# Patient Record
Sex: Male | Born: 1940 | Race: White
Health system: Northeastern US, Academic
[De-identification: ages and names within clinical notes are randomized; demographics above are authoritative.]

## PROBLEM LIST (undated history)

## (undated) DIAGNOSIS — Z1501 Genetic susceptibility to malignant neoplasm of breast: Secondary | ICD-10-CM

## (undated) DIAGNOSIS — R011 Cardiac murmur, unspecified: Secondary | ICD-10-CM

## (undated) DIAGNOSIS — R7302 Impaired glucose tolerance (oral): Secondary | ICD-10-CM

## (undated) DIAGNOSIS — N400 Enlarged prostate without lower urinary tract symptoms: Secondary | ICD-10-CM

## (undated) DIAGNOSIS — E785 Hyperlipidemia, unspecified: Secondary | ICD-10-CM

## (undated) DIAGNOSIS — Z1509 Genetic susceptibility to other malignant neoplasm: Secondary | ICD-10-CM

## (undated) HISTORY — DX: Benign prostatic hyperplasia without lower urinary tract symptoms: N40.0

## (undated) HISTORY — DX: Genetic susceptibility to malignant neoplasm of breast: Z15.01

## (undated) HISTORY — PX: CHOLECYSTECTOMY: SHX55

## (undated) HISTORY — DX: Genetic susceptibility to other malignant neoplasm: Z15.09

## (undated) HISTORY — DX: Cardiac murmur, unspecified: R01.1

## (undated) HISTORY — DX: Impaired glucose tolerance (oral): R73.02

## (undated) HISTORY — PX: TONSILLECTOMY: SUR1361

## (undated) HISTORY — PX: CIRCUMCISION: SUR203

## (undated) HISTORY — DX: Hyperlipidemia, unspecified: E78.5

---

## 2006-10-19 ENCOUNTER — Emergency Department: Payer: Self-pay | Admitting: Internal Medicine

## 2007-09-18 ENCOUNTER — Ambulatory Visit: Payer: Self-pay | Admitting: Gastroenterology

## 2007-10-14 ENCOUNTER — Ambulatory Visit: Payer: Self-pay | Admitting: Oncology

## 2007-10-29 ENCOUNTER — Ambulatory Visit: Payer: Self-pay | Admitting: Oncology

## 2007-11-11 ENCOUNTER — Ambulatory Visit: Payer: Self-pay | Admitting: Oncology

## 2011-06-13 ENCOUNTER — Ambulatory Visit: Payer: Self-pay | Admitting: Family Medicine

## 2011-12-15 ENCOUNTER — Ambulatory Visit: Payer: Self-pay | Admitting: Family Medicine

## 2012-07-16 DIAGNOSIS — R339 Retention of urine, unspecified: Secondary | ICD-10-CM | POA: Insufficient documentation

## 2012-07-16 DIAGNOSIS — N529 Male erectile dysfunction, unspecified: Secondary | ICD-10-CM | POA: Insufficient documentation

## 2012-09-24 ENCOUNTER — Ambulatory Visit: Payer: Self-pay | Admitting: Family Medicine

## 2012-12-31 ENCOUNTER — Ambulatory Visit: Payer: Self-pay

## 2013-07-11 ENCOUNTER — Ambulatory Visit: Payer: Self-pay | Admitting: Family Medicine

## 2013-12-18 ENCOUNTER — Ambulatory Visit: Payer: Self-pay | Admitting: Family Medicine

## 2014-01-13 ENCOUNTER — Ambulatory Visit (INDEPENDENT_AMBULATORY_CARE_PROVIDER_SITE_OTHER): Payer: Commercial Managed Care - HMO | Admitting: Family Medicine

## 2014-01-13 ENCOUNTER — Encounter: Payer: Self-pay | Admitting: Family Medicine

## 2014-01-13 VITALS — BP 136/60 | HR 59 | Temp 97.9°F | Resp 16 | Ht 67.5 in | Wt 179.2 lb

## 2014-01-13 DIAGNOSIS — E7800 Pure hypercholesterolemia, unspecified: Secondary | ICD-10-CM

## 2014-01-13 DIAGNOSIS — R1013 Epigastric pain: Secondary | ICD-10-CM

## 2014-01-13 LAB — COMPREHENSIVE METABOLIC PANEL
ALT: 14 U/L (ref 0–53)
AST: 16 U/L (ref 0–37)
Albumin: 4.5 g/dL (ref 3.5–5.2)
Alkaline Phosphatase: 50 U/L (ref 39–117)
BUN: 15 mg/dL (ref 6–23)
CALCIUM: 9.1 mg/dL (ref 8.4–10.5)
CO2: 27 meq/L (ref 19–32)
Chloride: 103 mEq/L (ref 96–112)
Creat: 0.88 mg/dL (ref 0.50–1.35)
GLUCOSE: 101 mg/dL — AB (ref 70–99)
Potassium: 5 mEq/L (ref 3.5–5.3)
SODIUM: 138 meq/L (ref 135–145)
TOTAL PROTEIN: 7.1 g/dL (ref 6.0–8.3)
Total Bilirubin: 0.7 mg/dL (ref 0.2–1.2)

## 2014-01-13 LAB — LIPID PANEL
CHOLESTEROL: 167 mg/dL (ref 0–200)
HDL: 66 mg/dL (ref >39)
LDL Cholesterol: 87 mg/dL (ref 0–99)
Total CHOL/HDL Ratio: 2.5 Ratio
Triglycerides: 71 mg/dL (ref <150)
VLDL: 14 mg/dL (ref 0–40)

## 2014-01-13 LAB — CBC WITH DIFFERENTIAL/PLATELET
Basophils Absolute: 0 10*3/uL (ref 0.0–0.1)
Basophils Relative: 0 % (ref 0–1)
Eosinophils Absolute: 0.1 10*3/uL (ref 0.0–0.7)
Eosinophils Relative: 1 % (ref 0–5)
HCT: 40.1 % (ref 39.0–52.0)
HEMOGLOBIN: 14 g/dL (ref 13.0–17.0)
LYMPHS ABS: 1.6 10*3/uL (ref 0.7–4.0)
Lymphocytes Relative: 19 % (ref 12–46)
MCH: 32.6 pg (ref 26.0–34.0)
MCHC: 34.9 g/dL (ref 30.0–36.0)
MCV: 93.3 fL (ref 78.0–100.0)
MONOS PCT: 8 % (ref 3–12)
Monocytes Absolute: 0.7 10*3/uL (ref 0.1–1.0)
NEUTROS ABS: 6.2 10*3/uL (ref 1.7–7.7)
Neutrophils Relative %: 72 % (ref 43–77)
Platelets: 300 10*3/uL (ref 150–400)
RBC: 4.3 MIL/uL (ref 4.22–5.81)
RDW: 12.7 % (ref 11.5–15.5)
WBC: 8.6 10*3/uL (ref 4.0–10.5)

## 2014-01-13 LAB — POCT URINALYSIS DIPSTICK
BILIRUBIN UA: NEGATIVE
Glucose, UA: NEGATIVE
KETONES UA: NEGATIVE
Leukocytes, UA: NEGATIVE
Nitrite, UA: NEGATIVE
Protein, UA: NEGATIVE
RBC UA: NEGATIVE
Spec Grav, UA: 1.025
Urobilinogen, UA: 0.2
pH, UA: 5.5

## 2014-01-13 MED ORDER — RANITIDINE HCL 150 MG PO CAPS: 150.0000 mg | ORAL_CAPSULE | Freq: Every day | ORAL | Status: DC

## 2014-01-13 MED ORDER — OMEPRAZOLE 20 MG PO CPDR: 20.0000 mg | DELAYED_RELEASE_CAPSULE | Freq: Every day | ORAL | Status: DC

## 2014-01-13 NOTE — Progress Notes (Signed)
Subjective:   This chart was scribed for Wardell Honour, MD by Forrestine Him, [facility] Scribe. This patient was seen in room 21 and the patient's care was started 10:23 AM.    Patient ID: Frederick Reeves, male    DOB: [birth year], 73 y.o.   MRN: [id number]  01/13/2014  stoamch pain x 6 months   HPI  HPI Comments: Frederick Reeves is a 73 y.o. male who presents to [facility] complaining of intermittent, moderate abdominal pain x 6 months that is unchanged. Describes this pain as "pressure" and "achy". Pt typically notes the pain at night time in the middle of the night; sometimes intense enough to wake him from sleep. States his appetite is not effected by this pain. He consumes a consistent small meal at noon daily consisting of lettuce, apples, onions, and tomatoes. Denies experiencing this pain after eating these meals. He denies any associated nausea, vomiting, diarrhea, belching, constipation, SOB, hematuria, dysuria, urinary frequency, bloody stool, or weight change with the abdominal pain. Pt was initially seen by Dr. Allen Norris of GI and was told his abdominal pain was related to air in his GI tract. Pt states he was then seen by Dr. Caryn Section for same and was treated with prescribed Hyoscyamine. Pt states this medication has not been effective for him so he stopped taking it after a few doses/two days. He has also tried OTC Ibuprofen with no improvement. States the discomfort is sometimes alleviating by consuming chocolate and passing gas. Unaware of any known foods that worsen his pain. Pt states he is taking Naproxen currently for knee pain. He has no other pertinent past medical history. No other concerns this visit.  Strong family history of cancer.  He carries the BRCA gene; both daughters have had breast cancer.    States he is working out about 3-4 times a week. No chest pain with exercise.  Brother recently died at the age of 8 of prostate and liver  cancer. Son in law also recently passed of prostate cancer.  Also presenting for cholesterol check.  Patient reports good compliance with medication, good tolerance to medication, and good symptom control.     Review of Systems  Constitutional: Negative for fever, chills, activity change, appetite change and unexpected weight change.  HENT: Negative for congestion.   Eyes: Negative for redness.  Respiratory: Negative for cough and shortness of breath.   Cardiovascular: Negative for chest pain.  Gastrointestinal: Positive for abdominal pain. Negative for nausea, vomiting, diarrhea, constipation, blood in stool, abdominal distention and anal bleeding.  Genitourinary: Negative for dysuria, urgency, frequency, hematuria and decreased urine volume.  Skin: Negative for rash.  Psychiatric/Behavioral: Negative for confusion.    No past medical history on file. Allergies not on file No current outpatient prescriptions on file.   No current facility-administered medications for this visit.   History   Social History  . Marital Status: Widowed    Spouse Name: N/A    Number of Children: N/A  . Years of Education: N/A   Occupational History  . Not on file.   Social History Main Topics  . Smoking status: Former Smoker    Quit date: 09/12/1990  . Smokeless tobacco: Not on file  . Alcohol Use: Yes     Comment: 14 drinks  . Drug Use: No  . Sexual Activity: Not on file   Other Topics Concern  . Not on file   Social History Narrative  .  No narrative on file   Family History  Problem Relation Age of Onset  . Cancer Mother   . Cancer Brother 30    prostate with liver mets       Objective:    BP 136/60  Pulse 59  Temp(Src) 97.9 F (36.6 C) (Oral)  Resp 16  Ht 5' 7.5" (1.715 m)  Wt 179 lb 3.2 oz (81.285 kg)  BMI 27.64 kg/m2  SpO2 97%  Physical Exam  Nursing note and vitals reviewed. Constitutional: He is oriented to person, place, and time. He appears well-developed  and well-nourished. No distress.  HENT:  Head: Normocephalic and atraumatic.  Mouth/Throat: Oropharynx is clear and moist.  Eyes: Conjunctivae and EOM are normal. Pupils are equal, round, and reactive to light.  Neck: Normal range of motion. Neck supple. No thyromegaly present.  Cardiovascular: Normal rate, regular rhythm, normal heart sounds and intact distal pulses.  Exam reveals no gallop and no friction rub.   No murmur heard. Pulmonary/Chest: Effort normal and breath sounds normal. No respiratory distress. He has no wheezes. He has no rales.  Abdominal: Soft. Bowel sounds are normal. He exhibits no distension and no mass. There is no hepatosplenomegaly. There is tenderness. There is no rebound, no guarding and no CVA tenderness. No hernia.  Tenderness to palpation just superior to umbilicus   Musculoskeletal: Normal range of motion.  Lymphadenopathy:    He has no cervical adenopathy.  Neurological: He is alert and oriented to person, place, and time.  Skin: Skin is warm and dry. No rash noted. He is not diaphoretic.  Psychiatric: He has a normal mood and affect. His behavior is normal.   No results found for this or any previous visit.     Assessment & Plan:  Abdominal pain, epigastric - Plan: Ambulatory referral to Gastroenterology, POCT urinalysis dipstick, CBC with Differential  Pure hypercholesterolemia - Plan: CBC with Differential, Comprehensive metabolic panel, Lipid panel  1. Epigastric abdominal pain:  New.  S/p consultation by Dr. Allen Norris of GI in [place] without diagnostic testing or intervention.  S/p trial of hyoscyamine without improvement.  Rx for Prilosec provided to use daily.  Refer to [facility] for second opinion.  Location and symptoms suggestive of gastritis, PUD, GERD.  Obtain records from previous PCP. Obtain labs including CBC, CMET, u/a.  2. Hypercholesterolemia: controlled; obtain labs.   Continue current medications.  No orders of the defined types were  placed in this encounter.    No Follow-up on file.    I personally performed the services described in this documentation, which was scribed in my presence. The recorded information has been reviewed and is accurate.   Reginia Forts, M.D.  [facility] [address] [place], [place]  [phone] phone [phone] fax

## 2014-03-05 ENCOUNTER — Telehealth: Payer: Self-pay

## 2014-03-05 DIAGNOSIS — N400 Enlarged prostate without lower urinary tract symptoms: Secondary | ICD-10-CM

## 2014-03-05 NOTE — Telephone Encounter (Signed)
Pt has a urologist and needs a referral to see them. Please enter referral for insurance purposes. Reminded him that he needs to come in for a CPE per Dr. Claudell KyleSmith OV.  Urologist: T[facility] [place]- same building as [facility] Urinary Frequency- Flomax refills

## 2014-03-05 NOTE — Telephone Encounter (Signed)
Pt left a voicemail stating that dr copland wanted him to see a urologist in [place] but nothing is in the chart   Please call [phone]

## 2014-03-05 NOTE — Telephone Encounter (Signed)
Cell phone [phone]

## 2014-03-06 NOTE — Telephone Encounter (Signed)
Call patient --- referral placed for urology/[facility] in [place]. Referral should contact patient within upcoming two weeks with appointment time.

## 2014-03-06 NOTE — Telephone Encounter (Signed)
LMVM that referral has been placed for urologist, Dr. Trey Paulaichard Hart in [place] and you should receive a call within the next two weeks with an appt time.  Please call us back with any questions.

## 2014-06-02 ENCOUNTER — Telehealth: Payer: Self-pay

## 2014-06-02 MED ORDER — SIMVASTATIN 40 MG PO TABS: 40.0000 mg | ORAL_TABLET | Freq: Every day | ORAL | Status: DC

## 2014-06-02 NOTE — Telephone Encounter (Signed)
Refill  simvastatin (ZOCOR) 40 MG tablet  Patient has an appointment with Dr. Katrinka Blazing on the 29th he will be out of meds prior to seeing her.   [employer]   [phone]

## 2014-06-09 ENCOUNTER — Ambulatory Visit (INDEPENDENT_AMBULATORY_CARE_PROVIDER_SITE_OTHER): Payer: Commercial Managed Care - HMO | Admitting: Family Medicine

## 2014-06-09 ENCOUNTER — Encounter: Payer: Self-pay | Admitting: Family Medicine

## 2014-06-09 VITALS — BP 140/68 | HR 74 | Temp 97.9°F | Resp 16 | Ht 68.0 in | Wt 182.0 lb

## 2014-06-09 DIAGNOSIS — N400 Enlarged prostate without lower urinary tract symptoms: Secondary | ICD-10-CM

## 2014-06-09 DIAGNOSIS — E7800 Pure hypercholesterolemia, unspecified: Secondary | ICD-10-CM | POA: Insufficient documentation

## 2014-06-09 DIAGNOSIS — Z1509 Genetic susceptibility to other malignant neoplasm: Secondary | ICD-10-CM

## 2014-06-09 DIAGNOSIS — Z15060 Genetic susceptibility to colorectal cancer: Secondary | ICD-10-CM | POA: Insufficient documentation

## 2014-06-09 DIAGNOSIS — Z803 Family history of malignant neoplasm of breast: Secondary | ICD-10-CM

## 2014-06-09 DIAGNOSIS — R7309 Other abnormal glucose: Secondary | ICD-10-CM

## 2014-06-09 DIAGNOSIS — Z8042 Family history of malignant neoplasm of prostate: Secondary | ICD-10-CM

## 2014-06-09 DIAGNOSIS — Z0000 Encounter for general adult medical examination without abnormal findings: Secondary | ICD-10-CM

## 2014-06-09 DIAGNOSIS — Z1501 Genetic susceptibility to malignant neoplasm of breast: Secondary | ICD-10-CM

## 2014-06-09 DIAGNOSIS — Z23 Encounter for immunization: Secondary | ICD-10-CM

## 2014-06-09 LAB — COMPREHENSIVE METABOLIC PANEL
ALT: 17 U/L (ref 0–53)
AST: 19 U/L (ref 0–37)
Albumin: 4.6 g/dL (ref 3.5–5.2)
Alkaline Phosphatase: 61 U/L (ref 39–117)
BILIRUBIN TOTAL: 0.7 mg/dL (ref 0.2–1.2)
BUN: 12 mg/dL (ref 6–23)
CO2: 25 mEq/L (ref 19–32)
CREATININE: 0.93 mg/dL (ref 0.50–1.35)
Calcium: 9.2 mg/dL (ref 8.4–10.5)
Chloride: 105 mEq/L (ref 96–112)
GLUCOSE: 105 mg/dL — AB (ref 70–99)
Potassium: 4.1 mEq/L (ref 3.5–5.3)
Sodium: 141 mEq/L (ref 135–145)
Total Protein: 7.3 g/dL (ref 6.0–8.3)

## 2014-06-09 LAB — CBC WITH DIFFERENTIAL/PLATELET
BASOS PCT: 0 % (ref 0–1)
Basophils Absolute: 0 10*3/uL (ref 0.0–0.1)
EOS ABS: 0.1 10*3/uL (ref 0.0–0.7)
Eosinophils Relative: 1 % (ref 0–5)
HCT: 41.4 % (ref 39.0–52.0)
HEMOGLOBIN: 14.4 g/dL (ref 13.0–17.0)
Lymphocytes Relative: 15 % (ref 12–46)
Lymphs Abs: 1.3 10*3/uL (ref 0.7–4.0)
MCH: 32.7 pg (ref 26.0–34.0)
MCHC: 34.8 g/dL (ref 30.0–36.0)
MCV: 94.1 fL (ref 78.0–100.0)
MONOS PCT: 7 % (ref 3–12)
Monocytes Absolute: 0.6 10*3/uL (ref 0.1–1.0)
Neutro Abs: 6.6 10*3/uL (ref 1.7–7.7)
Neutrophils Relative %: 77 % (ref 43–77)
Platelets: 334 10*3/uL (ref 150–400)
RBC: 4.4 MIL/uL (ref 4.22–5.81)
RDW: 12.6 % (ref 11.5–15.5)
WBC: 8.6 10*3/uL (ref 4.0–10.5)

## 2014-06-09 LAB — HEMOGLOBIN A1C
Hgb A1c MFr Bld: 5.4 % (ref <5.7)
MEAN PLASMA GLUCOSE: 108 mg/dL (ref <117)

## 2014-06-09 LAB — LIPID PANEL
CHOL/HDL RATIO: 2.3 ratio
CHOLESTEROL: 167 mg/dL (ref 0–200)
HDL: 73 mg/dL (ref >39)
LDL Cholesterol: 79 mg/dL (ref 0–99)
TRIGLYCERIDES: 73 mg/dL (ref <150)
VLDL: 15 mg/dL (ref 0–40)

## 2014-06-09 MED ORDER — SIMVASTATIN 40 MG PO TABS: 40.0000 mg | ORAL_TABLET | Freq: Every day | ORAL | Status: DC

## 2014-06-09 NOTE — Patient Instructions (Signed)

## 2014-06-09 NOTE — Progress Notes (Signed)
Subjective:    Patient ID: Frederick Reeves, male    DOB: 04/02/1941, 73 y.o.   MRN: [id number]  06/09/2014  Medication Refill   HPI This 73 y.o. male presents for Annual Wellness Exam.  Last physical:  2012. Colonoscopy: 2013; no polyps; no polyps since first colonoscopy in 1990s.  Iftikhar. TDAP:  UTD. Pneumovax: UTD.   Zostavax:  2008. Influenza:2015 Eye exam:  2013; no glaucoma or cataracts; +glasses sometimes. Dental exam:  Every six months.  Angelena Sole.  BPH:  S/p Urological evaluation: PSA was 0.6.  Brother with prostate cancer with mets at age 2.    Hyperlipidemia:  Patient reports good compliance with medication, good tolerance to medication, and good symptom control.    Abdominal pain epigastric: now resolved; started feeling better two weeks after last visit.  Took PPI and H2 blocker for two months.  Feels well.    Family history of breast cancer/BRCA gene carrier: last mammogram two to three years ago ago the [facility] in [place].    Review of Systems  Constitutional: Negative for fever, chills, diaphoresis, activity change, appetite change, fatigue and unexpected weight change.  HENT: Negative for congestion, dental problem, drooling, ear discharge, ear pain, facial swelling, hearing loss, mouth sores, nosebleeds, postnasal drip, rhinorrhea, sinus pressure, sneezing, sore throat, tinnitus, trouble swallowing and voice change.   Eyes: Negative for photophobia, pain, discharge, redness, itching and visual disturbance.  Respiratory: Negative for apnea, cough, choking, chest tightness, shortness of breath, wheezing and stridor.   Cardiovascular: Negative for chest pain, palpitations and leg swelling.  Gastrointestinal: Negative for nausea, vomiting, abdominal pain, diarrhea, constipation, blood in stool, abdominal distention, anal bleeding and rectal pain.  Endocrine: Negative for cold intolerance, heat intolerance, polydipsia, polyphagia and polyuria.    Genitourinary: Negative for dysuria, urgency, frequency, hematuria, flank pain, decreased urine volume, discharge, penile swelling, scrotal swelling, enuresis, difficulty urinating, genital sores, penile pain and testicular pain.  Musculoskeletal: Positive for back pain. Negative for arthralgias, gait problem, joint swelling, myalgias, neck pain and neck stiffness.  Skin: Negative for color change, pallor, rash and wound.  Allergic/Immunologic: Negative for environmental allergies, food allergies and immunocompromised state.  Neurological: Negative for dizziness, tremors, seizures, syncope, facial asymmetry, speech difficulty, weakness, light-headedness, numbness and headaches.  Hematological: Negative for adenopathy. Does not bruise/bleed easily.  Psychiatric/Behavioral: Positive for sleep disturbance and decreased concentration. Negative for suicidal ideas, hallucinations, behavioral problems, confusion, self-injury, dysphoric mood and agitation. The patient is not nervous/anxious and is not hyperactive.     Past Medical History  Diagnosis Date  . Heart murmur   . Hyperlipidemia   . BPH (benign prostatic hyperplasia)    Past Surgical History  Procedure Laterality Date  . Circumcision    . Tonsillectomy    . Cholecystectomy     No Known Allergies Current Outpatient Prescriptions  Medication Sig Dispense Refill  . PRESCRIPTION MEDICATION Tamsulosin 0.4 mg taking      . simvastatin (ZOCOR) 40 MG tablet Take 1 tablet (40 mg total) by mouth daily.  90 tablet  3   No current facility-administered medications for this visit.   History   Social History  . Marital Status: Widowed    Spouse Name: N/A    Number of Children: N/A  . Years of Education: N/A   Occupational History  . Not on file.   Social History Main Topics  . Smoking status: Former Smoker    Quit date: 09/12/1990  . Smokeless tobacco: Not on file  .  Alcohol Use: Yes     Comment: 14 drinks  . Drug Use: No  .  Sexual Activity: Not Currently   Other Topics Concern  . Not on file   Social History Narrative   Marital status: widowed;       Children:  2 daughters; no grandchildren.  1 in [place]; 1 in [place].      Lives: alone      Employment: retired at age 56.      Tobacco; quit smoking 1992; smoked x 35.      Alcohol:  2 beers or glasses of wine daily; no more on weekends.  Drinks more beer in [place].      Drugs: none      Exercise:  Gym 3 days per week; manual weights, sauna.  Golfing; walks golf course three times per week.            Family History  Problem Relation Age of Onset  . Cancer Mother 65    Breast cancer  . Cancer Brother 23    prostate with liver mets  . COPD Father   . Cancer Sister 18    breast cancer        Objective:    BP 140/68  Pulse 74  Temp(Src) 97.9 F (36.6 C) (Oral)  Resp 16  Ht 5' 8"  (1.727 m)  Wt 182 lb (82.555 kg)  BMI 27.68 kg/m2  SpO2 96% Physical Exam  Constitutional: He is oriented to person, place, and time. He appears well-developed and well-nourished. No distress.  HENT:  Head: Normocephalic and atraumatic.  Right Ear: External ear normal.  Left Ear: External ear normal.  Nose: Nose normal.  Mouth/Throat: Oropharynx is clear and moist.  Eyes: Conjunctivae and EOM are normal. Pupils are equal, round, and reactive to light.  Neck: Normal range of motion. Neck supple. Carotid bruit is not present. No thyromegaly present.  Cardiovascular: Normal rate, regular rhythm, normal heart sounds and intact distal pulses.  Exam reveals no gallop and no friction rub.   No murmur heard. Pulmonary/Chest: Effort normal and breath sounds normal. He has no wheezes. He has no rales.  Abdominal: Soft. Bowel sounds are normal. He exhibits no distension and no mass. There is no tenderness. There is no rebound and no guarding.  Musculoskeletal:       Right shoulder: Normal.       Left shoulder: Normal.       Cervical back: Normal.  Lymphadenopathy:    He  has no cervical adenopathy.  Neurological: He is alert and oriented to person, place, and time. He has normal reflexes. No cranial nerve deficit. He exhibits normal muscle tone. Coordination normal.  Skin: Skin is warm and dry. No rash noted. He is not diaphoretic.  Psychiatric: He has a normal mood and affect. His behavior is normal. Judgment and thought content normal.   PREVNAR-13 AND INFLUENZA VACCINES ADMINISTERED.     Assessment & Plan:   1. Need for prophylactic vaccination and inoculation against influenza   2. Routine general medical examination at a health care facility   3. Pure hypercholesterolemia   4. Other abnormal glucose   5. Need for prophylactic vaccination against Streptococcus pneumoniae (pneumococcus)   6. Family history of breast cancer in first degree relative   7. BRCA gene positive   8. Family history of prostate cancer    1. Annual Wellness Exam Subsequent:  Anticipatory guidance --- continued exercise, weight maintenance, ASA 36m daily.  Colonoscopy UTD.  Immunizations reviewed; s/p Prevnar-13 and influenza vaccines in office. Independent with ADLs.  No hearing loss.  Low fall risk.  No evidence of depression.   2.  Hypercholesterolemia: controlled; obtain labs; refill provided. 3.  Glucose intolerance: controlled with diet, exercise, weight loss.  Obtain labs. 4.  BPH: stable; s/p urology consultation.  Continue Flomax at current dose. 5.  BRCA gene +/family history of breast cancer in mother, sister, two daughters.  Refer for diagnostic mammogram.  6.  Family history of prostate cancer: stable; s/p urological follow-up; PSA 0.6.  7. S/p Prevnar-13 and influenza vaccines.  Meds ordered this encounter  Medications  . simvastatin (ZOCOR) 40 MG tablet    Sig: Take 1 tablet (40 mg total) by mouth daily.    Dispense:  90 tablet    Refill:  3    Return in about 6 months (around 12/08/2014) for recheck cholesterol.  Reginia Forts, M.D.  [facility] [address] [place], [place]  [address] [phone] phone [phone] fax

## 2014-06-13 ENCOUNTER — Other Ambulatory Visit: Payer: Self-pay | Admitting: Radiology

## 2014-06-13 DIAGNOSIS — N6320 Unspecified lump in the left breast, unspecified quadrant: Secondary | ICD-10-CM

## 2014-06-13 DIAGNOSIS — Z803 Family history of malignant neoplasm of breast: Secondary | ICD-10-CM

## 2014-06-17 ENCOUNTER — Other Ambulatory Visit: Payer: Self-pay | Admitting: Family Medicine

## 2014-06-17 DIAGNOSIS — N6320 Unspecified lump in the left breast, unspecified quadrant: Secondary | ICD-10-CM

## 2014-06-24 ENCOUNTER — Ambulatory Visit
Admission: RE | Admit: 2014-06-24 | Discharge: 2014-06-24 | Disposition: A | Discharge status: Home / Self Care | Payer: Commercial Managed Care - HMO | Source: Ambulatory Visit | Attending: Family Medicine | Admitting: Family Medicine

## 2014-06-24 ENCOUNTER — Other Ambulatory Visit: Payer: Self-pay | Admitting: *Deleted

## 2014-06-24 DIAGNOSIS — Z803 Family history of malignant neoplasm of breast: Secondary | ICD-10-CM

## 2014-06-24 DIAGNOSIS — Z1501 Genetic susceptibility to malignant neoplasm of breast: Secondary | ICD-10-CM

## 2014-06-24 DIAGNOSIS — N6320 Unspecified lump in the left breast, unspecified quadrant: Secondary | ICD-10-CM

## 2014-06-24 DIAGNOSIS — Z1509 Genetic susceptibility to other malignant neoplasm: Secondary | ICD-10-CM

## 2014-12-08 ENCOUNTER — Encounter: Payer: Self-pay | Admitting: Family Medicine

## 2014-12-08 ENCOUNTER — Ambulatory Visit (INDEPENDENT_AMBULATORY_CARE_PROVIDER_SITE_OTHER): Payer: Commercial Managed Care - HMO | Admitting: Family Medicine

## 2014-12-08 VITALS — BP 150/72 | HR 61 | Temp 97.7°F | Resp 16 | Ht 68.0 in | Wt 178.0 lb

## 2014-12-08 DIAGNOSIS — R198 Other specified symptoms and signs involving the digestive system and abdomen: Secondary | ICD-10-CM | POA: Diagnosis not present

## 2014-12-08 DIAGNOSIS — R030 Elevated blood-pressure reading, without diagnosis of hypertension: Secondary | ICD-10-CM

## 2014-12-08 DIAGNOSIS — IMO0001 Reserved for inherently not codable concepts without codable children: Secondary | ICD-10-CM

## 2014-12-08 DIAGNOSIS — R6889 Other general symptoms and signs: Secondary | ICD-10-CM

## 2014-12-08 DIAGNOSIS — R739 Hyperglycemia, unspecified: Secondary | ICD-10-CM | POA: Diagnosis not present

## 2014-12-08 DIAGNOSIS — E7800 Pure hypercholesterolemia, unspecified: Secondary | ICD-10-CM

## 2014-12-08 DIAGNOSIS — L821 Other seborrheic keratosis: Secondary | ICD-10-CM

## 2014-12-08 DIAGNOSIS — R0989 Other specified symptoms and signs involving the circulatory and respiratory systems: Secondary | ICD-10-CM

## 2014-12-08 DIAGNOSIS — J302 Other seasonal allergic rhinitis: Secondary | ICD-10-CM | POA: Diagnosis not present

## 2014-12-08 LAB — COMPREHENSIVE METABOLIC PANEL
ALBUMIN: 4.4 g/dL (ref 3.5–5.2)
ALT: 15 U/L (ref 0–53)
AST: 15 U/L (ref 0–37)
Alkaline Phosphatase: 64 U/L (ref 39–117)
BUN: 12 mg/dL (ref 6–23)
CALCIUM: 9 mg/dL (ref 8.4–10.5)
CO2: 26 meq/L (ref 19–32)
CREATININE: 0.88 mg/dL (ref 0.50–1.35)
Chloride: 103 mEq/L (ref 96–112)
GLUCOSE: 87 mg/dL (ref 70–99)
Potassium: 4.2 mEq/L (ref 3.5–5.3)
SODIUM: 139 meq/L (ref 135–145)
Total Bilirubin: 0.8 mg/dL (ref 0.2–1.2)
Total Protein: 7 g/dL (ref 6.0–8.3)

## 2014-12-08 LAB — CBC WITH DIFFERENTIAL/PLATELET
BASOS PCT: 0 % (ref 0–1)
Basophils Absolute: 0 10*3/uL (ref 0.0–0.1)
Eosinophils Absolute: 0.1 10*3/uL (ref 0.0–0.7)
Eosinophils Relative: 2 % (ref 0–5)
HCT: 41.9 % (ref 39.0–52.0)
HEMOGLOBIN: 14.5 g/dL (ref 13.0–17.0)
LYMPHS ABS: 1.5 10*3/uL (ref 0.7–4.0)
LYMPHS PCT: 21 % (ref 12–46)
MCH: 32.5 pg (ref 26.0–34.0)
MCHC: 34.6 g/dL (ref 30.0–36.0)
MCV: 93.9 fL (ref 78.0–100.0)
MONO ABS: 0.6 10*3/uL (ref 0.1–1.0)
MONOS PCT: 8 % (ref 3–12)
MPV: 9.3 fL (ref 8.6–12.4)
NEUTROS PCT: 69 % (ref 43–77)
Neutro Abs: 5 10*3/uL (ref 1.7–7.7)
Platelets: 275 10*3/uL (ref 150–400)
RBC: 4.46 MIL/uL (ref 4.22–5.81)
RDW: 12.1 % (ref 11.5–15.5)
WBC: 7.2 10*3/uL (ref 4.0–10.5)

## 2014-12-08 LAB — LIPID PANEL
Cholesterol: 160 mg/dL (ref 0–200)
HDL: 69 mg/dL (ref >=40)
LDL Cholesterol: 78 mg/dL (ref 0–99)
TRIGLYCERIDES: 67 mg/dL (ref <150)
Total CHOL/HDL Ratio: 2.3 Ratio
VLDL: 13 mg/dL (ref 0–40)

## 2014-12-08 LAB — HEMOGLOBIN A1C
Hgb A1c MFr Bld: 5.7 % — ABNORMAL HIGH (ref <5.7)
Mean Plasma Glucose: 117 mg/dL — ABNORMAL HIGH (ref <117)

## 2014-12-08 NOTE — Progress Notes (Signed)
Subjective:    Patient ID: Frederick Reeves, male    DOB: November 16, 1940, 74 y.o.   MRN: [id number]  12/08/2014  Hyperlipidemia   HPI This 74 y.o. male presents for six month follow-up:  1. Hyperlipidemia:  Patient reports good compliance with medication, good tolerance to medication, and good symptom control.   2.  Hyperglycemia:  Due for repeat today.  Weight down since last visit.  3.  Throat clearing:  Frequent intermittent throat clearing. Occurs every morning; +lingering.  No nasal congestion; horrible rhinorrhea every morning; uses 5-10 tissues every morning.  Occurs year-round for past two years.  Resolves after breakfast.  No heartburn or indigestion; no n/v/d/c.  No abdominal pain.  Appetite is good.  Weight is down a little.    4.  Skin lesions: has all over.  Last dermatology evaluation six years ago; not sure of name of dermatologist.  No history of skin cancers.    5. Health maintenance: S/p dental exam last week. S/p eye exam since last visit; +new glasses.     Review of Systems  Constitutional: Negative for fever, chills, diaphoresis, activity change, appetite change and fatigue.  HENT: Positive for postnasal drip and rhinorrhea. Negative for congestion, ear pain, sinus pressure, sneezing, sore throat, trouble swallowing and voice change.   Eyes: Negative for visual disturbance.  Respiratory: Negative for cough and shortness of breath.   Cardiovascular: Negative for chest pain, palpitations and leg swelling.  Gastrointestinal: Negative for nausea, vomiting, abdominal pain, diarrhea, constipation, blood in stool and abdominal distention.  Endocrine: Negative for cold intolerance, heat intolerance, polydipsia, polyphagia and polyuria.  Skin: Positive for color change. Negative for rash and wound.  Neurological: Negative for dizziness, tremors, seizures, syncope, facial asymmetry, speech difficulty, weakness, light-headedness, numbness and headaches.    Psychiatric/Behavioral: Negative for dysphoric mood.    Past Medical History  Diagnosis Date  . Heart murmur   . Hyperlipidemia   . BPH (benign prostatic hyperplasia)    Past Surgical History  Procedure Laterality Date  . Circumcision    . Tonsillectomy    . Cholecystectomy     No Known Allergies Current Outpatient Prescriptions  Medication Sig Dispense Refill  . PRESCRIPTION MEDICATION Tamsulosin 0.4 mg taking    . simvastatin (ZOCOR) 40 MG tablet Take 1 tablet (40 mg total) by mouth daily. 90 tablet 3   No current facility-administered medications for this visit.       Objective:    BP 150/72 mmHg  Pulse 61  Temp(Src) 97.7 F (36.5 C) (Oral)  Resp 16  Ht _0  (1.727 m)  Wt 178 lb (80.74 kg)  BMI 27.07 kg/m2  SpO2 95% Physical Exam  Constitutional: He is oriented to person, place, and time. He appears well-developed and well-nourished. No distress.  HENT:  Head: Normocephalic and atraumatic.  Right Ear: External ear normal.  Left Ear: External ear normal.  Nose: Nose normal.  Mouth/Throat: Oropharynx is clear and moist.  Eyes: Conjunctivae and EOM are normal. Pupils are equal, round, and reactive to light.  Neck: Normal range of motion. Neck supple. Carotid bruit is not present. No thyromegaly present.  Cardiovascular: Normal rate, regular rhythm, normal heart sounds and intact distal pulses.  Exam reveals no gallop and no friction rub.   No murmur heard. Pulmonary/Chest: Effort normal and breath sounds normal. He has no wheezes. He has no rales.  Abdominal: Soft. Bowel sounds are normal. He exhibits no distension and no mass. There is no tenderness. There is  no rebound and no guarding.  Lymphadenopathy:    He has no cervical adenopathy.  Neurological: He is alert and oriented to person, place, and time. No cranial nerve deficit.  Skin: Skin is warm and dry. No rash noted. He is not diaphoretic.  +scattered seborrhea keratoses on torso with some color  variation.  Psychiatric: He has a normal mood and affect. His behavior is normal.  Nursing note and vitals reviewed.  Results for orders placed or performed in visit on 06/09/14  CBC with Differential  Result Value Ref Range   WBC 8.6 4.0 - 10.5 K/uL   RBC 4.40 4.22 - 5.81 MIL/uL   Hemoglobin 14.4 13.0 - 17.0 g/dL   HCT 41.4 39.0 - 52.0 %   MCV 94.1 78.0 - 100.0 fL   MCH 32.7 26.0 - 34.0 pg   MCHC 34.8 30.0 - 36.0 g/dL   RDW 12.6 11.5 - 15.5 %   Platelets 334 150 - 400 K/uL   Neutrophils Relative % 77 43 - 77 %   Neutro Abs 6.6 1.7 - 7.7 K/uL   Lymphocytes Relative 15 12 - 46 %   Lymphs Abs 1.3 0.7 - 4.0 K/uL   Monocytes Relative 7 3 - 12 %   Monocytes Absolute 0.6 0.1 - 1.0 K/uL   Eosinophils Relative 1 0 - 5 %   Eosinophils Absolute 0.1 0.0 - 0.7 K/uL   Basophils Relative 0 0 - 1 %   Basophils Absolute 0.0 0.0 - 0.1 K/uL   Smear Review Criteria for review not met   Comprehensive metabolic panel  Result Value Ref Range   Sodium 141 135 - 145 mEq/L   Potassium 4.1 3.5 - 5.3 mEq/L   Chloride 105 96 - 112 mEq/L   CO2 25 19 - 32 mEq/L   Glucose, Bld 105 (H) 70 - 99 mg/dL   BUN 12 6 - 23 mg/dL   Creat 0.93 0.50 - 1.35 mg/dL   Total Bilirubin 0.7 0.2 - 1.2 mg/dL   Alkaline Phosphatase 61 39 - 117 U/L   AST 19 0 - 37 U/L   ALT 17 0 - 53 U/L   Total Protein 7.3 6.0 - 8.3 g/dL   Albumin 4.6 3.5 - 5.2 g/dL   Calcium 9.2 8.4 - 10.5 mg/dL  Hemoglobin A1c  Result Value Ref Range   Hgb A1c MFr Bld 5.4 <5.7 %   Mean Plasma Glucose 108 <117 mg/dL  Lipid panel  Result Value Ref Range   Cholesterol 167 0 - 200 mg/dL   Triglycerides 73 <150 mg/dL   HDL 73 >39 mg/dL   Total CHOL/HDL Ratio 2.3 Ratio   VLDL 15 0 - 40 mg/dL   LDL Cholesterol 79 0 - 99 mg/dL       Assessment & Plan:   1. Pure hypercholesterolemia   2. Hyperglycemia   3. Throat clearing   4. Other seasonal allergic rhinitis   5. Keratosis seborrheica   6. Blood pressure elevated     1.  Hypercholesterolemia: controlled; obtain labs; continue current medications. 2. Hyperglycemia: recurrent at last visit; repeat glucose and HgbA1c today. 3.  Throat clearing: New.  Secondary to PND.  Treat with Claritin 82m daily; if no improvement in one month, call office for rx for Flonase. 4.  Allergic Rhinitis: uncontrolled; pt declined Flonase; recommend daily Claritin 144mdaily; if no improvement in one month, call for rx for Flonase. 5.  Blood pressure elevated: New. Repeat at next visit. 6.  Seborrhea  keratoses multiple with color variation: refer to dermatology.   No orders of the defined types were placed in this encounter.    Return in about 6 months (around 06/10/2015) for complete physical examiniation.     Kristi Elayne Guerin, M.D. [facility] [address] [place], [place]  [address] [phone] phone [phone] fax

## 2014-12-08 NOTE — Patient Instructions (Signed)
1.  START TAKING CLARITIN/LORATADINE 10MG  ONE TABLET DAILY FOR THROAT CLEARING.

## 2015-06-22 ENCOUNTER — Ambulatory Visit (INDEPENDENT_AMBULATORY_CARE_PROVIDER_SITE_OTHER): Payer: Commercial Managed Care - HMO | Admitting: Family Medicine

## 2015-06-22 ENCOUNTER — Encounter: Payer: Self-pay | Admitting: Family Medicine

## 2015-06-22 VITALS — BP 148/78 | HR 69 | Temp 97.9°F | Resp 16 | Ht 67.75 in | Wt 182.4 lb

## 2015-06-22 DIAGNOSIS — E7800 Pure hypercholesterolemia, unspecified: Secondary | ICD-10-CM

## 2015-06-22 DIAGNOSIS — N400 Enlarged prostate without lower urinary tract symptoms: Secondary | ICD-10-CM

## 2015-06-22 DIAGNOSIS — Z0000 Encounter for general adult medical examination without abnormal findings: Secondary | ICD-10-CM | POA: Diagnosis not present

## 2015-06-22 DIAGNOSIS — E785 Hyperlipidemia, unspecified: Secondary | ICD-10-CM | POA: Diagnosis not present

## 2015-06-22 DIAGNOSIS — Z1501 Genetic susceptibility to malignant neoplasm of breast: Secondary | ICD-10-CM | POA: Diagnosis not present

## 2015-06-22 DIAGNOSIS — Z8042 Family history of malignant neoplasm of prostate: Secondary | ICD-10-CM

## 2015-06-22 DIAGNOSIS — Z23 Encounter for immunization: Secondary | ICD-10-CM | POA: Diagnosis not present

## 2015-06-22 DIAGNOSIS — Z1509 Genetic susceptibility to other malignant neoplasm: Secondary | ICD-10-CM

## 2015-06-22 DIAGNOSIS — R7302 Impaired glucose tolerance (oral): Secondary | ICD-10-CM

## 2015-06-22 LAB — COMPREHENSIVE METABOLIC PANEL
ALBUMIN: 4.7 g/dL (ref 3.6–5.1)
ALT: 22 U/L (ref 9–46)
AST: 20 U/L (ref 10–35)
Alkaline Phosphatase: 75 U/L (ref 40–115)
BILIRUBIN TOTAL: 0.8 mg/dL (ref 0.2–1.2)
BUN: 11 mg/dL (ref 7–25)
CO2: 27 mmol/L (ref 20–31)
Calcium: 9.3 mg/dL (ref 8.6–10.3)
Chloride: 102 mmol/L (ref 98–110)
Creat: 1.01 mg/dL (ref 0.70–1.18)
Glucose, Bld: 100 mg/dL — ABNORMAL HIGH (ref 65–99)
Potassium: 4.5 mmol/L (ref 3.5–5.3)
SODIUM: 140 mmol/L (ref 135–146)
TOTAL PROTEIN: 7.6 g/dL (ref 6.1–8.1)

## 2015-06-22 LAB — POCT URINALYSIS DIP (MANUAL ENTRY)
Bilirubin, UA: NEGATIVE
Blood, UA: NEGATIVE
Glucose, UA: NEGATIVE
Ketones, POC UA: NEGATIVE
LEUKOCYTES UA: NEGATIVE
Nitrite, UA: NEGATIVE
PROTEIN UA: NEGATIVE
Spec Grav, UA: 1.02
Urobilinogen, UA: 1
pH, UA: 6.5

## 2015-06-22 LAB — CBC WITH DIFFERENTIAL/PLATELET
BASOS ABS: 0 10*3/uL (ref 0.0–0.1)
BASOS PCT: 0 % (ref 0–1)
Eosinophils Absolute: 0.2 10*3/uL (ref 0.0–0.7)
Eosinophils Relative: 2 % (ref 0–5)
HCT: 43.6 % (ref 39.0–52.0)
HEMOGLOBIN: 15.1 g/dL (ref 13.0–17.0)
Lymphocytes Relative: 21 % (ref 12–46)
Lymphs Abs: 2.2 10*3/uL (ref 0.7–4.0)
MCH: 33 pg (ref 26.0–34.0)
MCHC: 34.6 g/dL (ref 30.0–36.0)
MCV: 95.2 fL (ref 78.0–100.0)
MPV: 9.1 fL (ref 8.6–12.4)
Monocytes Absolute: 1 10*3/uL (ref 0.1–1.0)
Monocytes Relative: 9 % (ref 3–12)
NEUTROS PCT: 68 % (ref 43–77)
Neutro Abs: 7.2 10*3/uL (ref 1.7–7.7)
Platelets: 317 10*3/uL (ref 150–400)
RBC: 4.58 MIL/uL (ref 4.22–5.81)
RDW: 12.3 % (ref 11.5–15.5)
WBC: 10.6 10*3/uL — ABNORMAL HIGH (ref 4.0–10.5)

## 2015-06-22 LAB — LIPID PANEL
CHOL/HDL RATIO: 2.6 ratio (ref <=5.0)
Cholesterol: 207 mg/dL — ABNORMAL HIGH (ref 125–200)
HDL: 81 mg/dL (ref >=40)
LDL CALC: 104 mg/dL (ref <130)
Triglycerides: 108 mg/dL (ref <150)
VLDL: 22 mg/dL (ref <30)

## 2015-06-22 MED ORDER — SIMVASTATIN 40 MG PO TABS: 40.0000 mg | ORAL_TABLET | Freq: Every day | ORAL | Status: DC

## 2015-06-22 MED ORDER — FLUTICASONE PROPIONATE 50 MCG/ACT NA SUSP: 2.0000 | Freq: Every day | NASAL | Status: AC

## 2015-06-22 NOTE — Patient Instructions (Signed)

## 2015-06-22 NOTE — Progress Notes (Signed)
Subjective:    Patient ID: Frederick Reeves, male    DOB: 10-Jul-1941, 74 y.o.   MRN: [id number]  06/22/2015  Annual Exam and Medication Refill   HPI This 74 y.o. male presents for Annual Wellness Examination.  Last physical:  06-09-2014 Colonoscopy:  2013 Iftikhar TDAP:  2008 Pneumovax:  2011; 2015 Zostavax:  2013 Influenza: today Eye exam:  Last year; 2015; new glasses.   Dental exam:  Every six months.  Hyperlipidemia: Patient reports good compliance with medication, good tolerance to medication, and good symptom control.    Glucose Intolerance:   Throat clearing: still present; intermittent; not taking Claritin; no improvement after one month of medication; still having rhinorrhea chronic; first thing in morning; throat clearing and rhinorrhea first thing every morning and then better.  Does not sleep in bed anymore.  If goes on R side, has an ache from R anterior chest to R back.  In [place], must sleep in bed.  Snapped something in R chest years ago; pain has recurred with aching . No indigestion or reflux.   Allergic Rhinitis: took Claritin for one month without improvement.    Skin lesions: referred to dermatology.  Blood pressure elevated:  Not checking BP at home.   Refusing medication.  Returned from [place] last week.  Had drank more than normal.    BPH:  Patient reports good compliance with medication, good tolerance to medication, and good symptom control.  [place].   NOCTURIA X 2.  STREAM IS GOOD.    June 05, 2015; release lanterns in back yard.  Daughter gets lanterns in [place].     Review of Systems  Constitutional: Negative for fever, chills, diaphoresis, activity change, appetite change, fatigue and unexpected weight change.  HENT: Negative for congestion, dental problem, drooling, ear discharge, ear pain, facial swelling, hearing loss, mouth sores, nosebleeds, postnasal drip, rhinorrhea, sinus pressure, sneezing, sore throat, tinnitus, trouble swallowing and  voice change.   Eyes: Negative for photophobia, pain, discharge, redness, itching and visual disturbance.  Respiratory: Negative for apnea, cough, choking, chest tightness, shortness of breath, wheezing and stridor.   Cardiovascular: Negative for chest pain, palpitations and leg swelling.  Gastrointestinal: Negative for nausea, vomiting, abdominal pain, diarrhea, constipation and blood in stool.  Endocrine: Negative for cold intolerance, heat intolerance, polydipsia, polyphagia and polyuria.  Genitourinary: Negative for dysuria, urgency, frequency, hematuria, flank pain, decreased urine volume, discharge, penile swelling, scrotal swelling, enuresis, difficulty urinating, genital sores, penile pain and testicular pain.  Musculoskeletal: Negative for myalgias, back pain, joint swelling, arthralgias, gait problem, neck pain and neck stiffness.  Skin: Negative for color change, pallor, rash and wound.  Allergic/Immunologic: Negative for environmental allergies, food allergies and immunocompromised state.  Neurological: Negative for dizziness, tremors, seizures, syncope, facial asymmetry, speech difficulty, weakness, light-headedness, numbness and headaches.  Hematological: Negative for adenopathy. Does not bruise/bleed easily.  Psychiatric/Behavioral: Negative for suicidal ideas, hallucinations, behavioral problems, confusion, sleep disturbance, self-injury, dysphoric mood, decreased concentration and agitation. The patient is not nervous/anxious and is not hyperactive.     Past Medical History  Diagnosis Date  . Heart murmur   . Hyperlipidemia   . BPH (benign prostatic hyperplasia)   . Glucose intolerance (impaired glucose tolerance)    Past Surgical History  Procedure Laterality Date  . Circumcision    . Tonsillectomy    . Cholecystectomy     No Known Allergies Current Outpatient Prescriptions  Medication Sig Dispense Refill  . PRESCRIPTION MEDICATION Tamsulosin 0.4 mg taking    .  simvastatin (ZOCOR) 40 MG tablet Take 1 tablet (40 mg total) by mouth daily. 90 tablet 3  . tamsulosin (FLOMAX) 0.4 MG CAPS capsule Take 0.4 mg by mouth.    . fluticasone (FLONASE) 50 MCG/ACT nasal spray Place 2 sprays into both nostrils daily. 16 g 6   No current facility-administered medications for this visit.   Social History   Social History  . Marital Status: Widowed    Spouse Name: N/A  . Number of Children: N/A  . Years of Education: N/A   Occupational History  . retired    Social History Main Topics  . Smoking status: Former Smoker    Quit date: 09/12/1990  . Smokeless tobacco: Not on file  . Alcohol Use: 0.0 oz/week    0 Standard drinks or equivalent per week     Comment: 14 drinks - beer or wine  . Drug Use: No  . Sexual Activity: Not Currently   Other Topics Concern  . Not on file   Social History Narrative   Marital status: widowed; not dating      Children:  2 daughters; no grandchildren.  1 in [place]; 1 in [place].      Lives: alone; travels to [place] frequently. Splits time between [facility] and [place].      Employment: retired at age 62.      Tobacco; quit smoking 1992; smoked x 35 years.      Alcohol:  2-3 beers or glasses of wine daily; no more on weekends.  Drinks more beer in [place].      Drugs: none      Exercise:  Gym 3 days per week; manual weights, sauna.  Golfing; walks golf course three times per week.      Seatbelt: 100%      Guns: none       Advanced Directives: yes; FULL CODE; HCPOA: Darcey Nora [phone]                  Family History  Problem Relation Age of Onset  . Cancer Mother 65    Breast cancer  . Cancer Brother 81    prostate with liver mets  . COPD Father   . Cancer Sister 70    breast cancer       Objective:    BP 148/78 mmHg  Pulse 69  Temp(Src) 97.9 F (36.6 C) (Oral)  Resp 16  Ht 5' 7.75" (1.721 m)  Wt 182 lb 6.4 oz (82.736 kg)  BMI 27.93 kg/m2 Physical Exam  Constitutional: He is oriented to person, place, and time. He  appears well-developed and well-nourished. No distress.  HENT:  Head: Normocephalic and atraumatic.  Right Ear: External ear normal.  Left Ear: External ear normal.  Nose: Nose normal.  Mouth/Throat: Oropharynx is clear and moist.  Eyes: Conjunctivae and EOM are normal. Pupils are equal, round, and reactive to light.  Neck: Normal range of motion. Neck supple. Carotid bruit is not present. No thyromegaly present.  Cardiovascular: Normal rate, regular rhythm, normal heart sounds and intact distal pulses.  Exam reveals no gallop and no friction rub.   No murmur heard. Pulmonary/Chest: Effort normal and breath sounds normal. He has no wheezes. He has no rales.  Abdominal: Soft. Bowel sounds are normal. He exhibits no distension and no mass. There is no tenderness. There is no rebound and no guarding.  Musculoskeletal:       Right shoulder: Normal.       Left shoulder: Normal.  Cervical back: Normal.  Lymphadenopathy:    He has no cervical adenopathy.  Neurological: He is alert and oriented to person, place, and time. He has normal reflexes. No cranial nerve deficit. He exhibits normal muscle tone. Coordination normal.  Skin: Skin is warm and dry. No rash noted. He is not diaphoretic.  Psychiatric: He has a normal mood and affect. His behavior is normal. Judgment and thought content normal.        Assessment & Plan:   1. Encounter for Medicare annual wellness exam   2. Hyperlipidemia   3. Glucose intolerance (impaired glucose tolerance)   4. BPH (benign prostatic hyperplasia)   5. Need for prophylactic vaccination and inoculation against influenza   6. BRCA gene positive   7. Family history of prostate cancer   8. Pure hypercholesterolemia     Orders Placed This Encounter  Procedures  . Flu Vaccine QUAD 36+ mos IM  . CBC with Differential/Platelet  . Comprehensive metabolic panel    Order Specific Question:  Has the patient fasted?    Answer:  Yes  . Hemoglobin A1c  .  Lipid panel    Order Specific Question:  Has the patient fasted?    Answer:  Yes  . POCT urinalysis dipstick   Meds ordered this encounter  Medications  . tamsulosin (FLOMAX) 0.4 MG CAPS capsule    Sig: Take 0.4 mg by mouth.  . simvastatin (ZOCOR) 40 MG tablet    Sig: Take 1 tablet (40 mg total) by mouth daily.    Dispense:  90 tablet    Refill:  3  . fluticasone (FLONASE) 50 MCG/ACT nasal spray    Sig: Place 2 sprays into both nostrils daily.    Dispense:  16 g    Refill:  6    Return in about 6 months (around 12/21/2015) for recheck high cholesterol.    Cornelia Walraven Elayne Guerin, M.D. [facility] [address] [place], [place]  [address] [phone] phone [phone] fax

## 2015-06-23 LAB — HEMOGLOBIN A1C
Hgb A1c MFr Bld: 5.8 % — ABNORMAL HIGH (ref <5.7)
MEAN PLASMA GLUCOSE: 120 mg/dL — AB (ref <117)

## 2015-09-15 ENCOUNTER — Other Ambulatory Visit: Payer: Self-pay

## 2015-09-15 DIAGNOSIS — Z1231 Encounter for screening mammogram for malignant neoplasm of breast: Secondary | ICD-10-CM

## 2015-09-30 ENCOUNTER — Other Ambulatory Visit: Payer: Self-pay | Admitting: *Deleted

## 2015-09-30 ENCOUNTER — Ambulatory Visit
Admission: RE | Admit: 2015-09-30 | Discharge: 2015-09-30 | Disposition: A | Discharge status: Home / Self Care | Payer: Commercial Managed Care - HMO | Source: Ambulatory Visit

## 2015-09-30 ENCOUNTER — Other Ambulatory Visit: Payer: Self-pay

## 2015-09-30 ENCOUNTER — Inpatient Hospital Stay: Admission: RE | Admit: 2015-09-30 | Payer: Commercial Managed Care - HMO | Source: Ambulatory Visit

## 2015-09-30 DIAGNOSIS — Z1501 Genetic susceptibility to malignant neoplasm of breast: Secondary | ICD-10-CM

## 2015-09-30 DIAGNOSIS — Z1509 Genetic susceptibility to other malignant neoplasm: Principal | ICD-10-CM

## 2015-11-17 ENCOUNTER — Ambulatory Visit: Payer: Commercial Managed Care - HMO | Admitting: Family Medicine

## 2015-12-21 ENCOUNTER — Ambulatory Visit: Payer: Self-pay | Admitting: Family Medicine

## 2015-12-29 ENCOUNTER — Ambulatory Visit (INDEPENDENT_AMBULATORY_CARE_PROVIDER_SITE_OTHER): Payer: Commercial Managed Care - HMO

## 2015-12-29 ENCOUNTER — Encounter: Payer: Self-pay | Admitting: Family Medicine

## 2015-12-29 ENCOUNTER — Ambulatory Visit (INDEPENDENT_AMBULATORY_CARE_PROVIDER_SITE_OTHER): Payer: Commercial Managed Care - HMO | Admitting: Family Medicine

## 2015-12-29 VITALS — BP 138/66 | HR 70 | Temp 97.8°F | Resp 16 | Ht 67.0 in | Wt 189.4 lb

## 2015-12-29 DIAGNOSIS — R079 Chest pain, unspecified: Secondary | ICD-10-CM

## 2015-12-29 DIAGNOSIS — M546 Pain in thoracic spine: Secondary | ICD-10-CM

## 2015-12-29 DIAGNOSIS — R7309 Other abnormal glucose: Secondary | ICD-10-CM | POA: Diagnosis not present

## 2015-12-29 DIAGNOSIS — Z8042 Family history of malignant neoplasm of prostate: Secondary | ICD-10-CM

## 2015-12-29 DIAGNOSIS — N400 Enlarged prostate without lower urinary tract symptoms: Secondary | ICD-10-CM

## 2015-12-29 DIAGNOSIS — Z803 Family history of malignant neoplasm of breast: Secondary | ICD-10-CM

## 2015-12-29 DIAGNOSIS — F4321 Adjustment disorder with depressed mood: Secondary | ICD-10-CM

## 2015-12-29 DIAGNOSIS — Z1501 Genetic susceptibility to malignant neoplasm of breast: Secondary | ICD-10-CM

## 2015-12-29 DIAGNOSIS — E7800 Pure hypercholesterolemia, unspecified: Secondary | ICD-10-CM

## 2015-12-29 DIAGNOSIS — Z1509 Genetic susceptibility to other malignant neoplasm: Secondary | ICD-10-CM

## 2015-12-29 LAB — CBC WITH DIFFERENTIAL/PLATELET
BASOS ABS: 0 {cells}/uL (ref 0–200)
Basophils Relative: 0 %
EOS PCT: 1 %
Eosinophils Absolute: 84 cells/uL (ref 15–500)
HCT: 41.3 % (ref 38.5–50.0)
Hemoglobin: 14.1 g/dL (ref 13.2–17.1)
LYMPHS PCT: 22 %
Lymphs Abs: 1848 cells/uL (ref 850–3900)
MCH: 32.6 pg (ref 27.0–33.0)
MCHC: 34.1 g/dL (ref 32.0–36.0)
MCV: 95.4 fL (ref 80.0–100.0)
MONOS PCT: 8 %
MPV: 9.3 fL (ref 7.5–12.5)
Monocytes Absolute: 672 cells/uL (ref 200–950)
NEUTROS ABS: 5796 {cells}/uL (ref 1500–7800)
NEUTROS PCT: 69 %
PLATELETS: 291 10*3/uL (ref 140–400)
RBC: 4.33 MIL/uL (ref 4.20–5.80)
RDW: 12.8 % (ref 11.0–15.0)
WBC: 8.4 10*3/uL (ref 3.8–10.8)

## 2015-12-29 NOTE — Progress Notes (Signed)
Subjective:    Patient ID: Frederick Reeves, male    DOB: 07-24-1941, 75 y.o.   MRN: [id number]  12/29/2015  Follow-up and Chest Pain   HPI This 75 y.o. male presents for six month follow-up:  1. Hypercholesterolemia: Patient reports good compliance with medication, good tolerance to medication, and good symptom control.    2.  Glucose intolerance: has gained seven pounds since being in [place].   3.  BRCA gene: daughter with recurrent breast cancer; s/p mastectomy and chemotherapy.  Pt went up for surgery and chemotherapy.  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  S/p mammogram in 09/2015.  Will be moving in with daughter.    4.  Depression: trying to sell house and not selling.  Emotionally stable.  Local daughter dating [place].   5. R chest pain and R thoracic pain: onset several years ago.  Felt a pop with some physical activity; improved from onset; has always had pain when laying supine at night.  Worried about cancer. S/p mammogram in 09/2015 after daughter's diagnosis.  No SOB; no cough.  No weight loss; no night sweats.  Intermittent neck pain.  No radiation into arms.     Review of Systems  Constitutional: Negative for fever, chills, diaphoresis, activity change, appetite change and fatigue.  Respiratory: Negative for cough and shortness of breath.   Cardiovascular: Positive for chest pain. Negative for palpitations and leg swelling.  Gastrointestinal: Negative for nausea, vomiting, abdominal pain, diarrhea, constipation, blood in stool, abdominal distention, anal bleeding and rectal pain.  Endocrine: Negative for cold intolerance, heat intolerance, polydipsia, polyphagia and polyuria.  Musculoskeletal: Positive for back pain, neck pain and neck stiffness.  Skin: Negative for color change, rash and wound.  Neurological: Negative for dizziness, tremors, seizures, syncope, facial asymmetry, speech difficulty, weakness, light-headedness, numbness and headaches.  Psychiatric/Behavioral:  Positive for dysphoric mood. Negative for sleep disturbance. The patient is not nervous/anxious.     Past Medical History  Diagnosis Date  . Heart murmur   . Hyperlipidemia   . BPH (benign prostatic hyperplasia)   . Glucose intolerance (impaired glucose tolerance)   . BRCA gene positive    Past Surgical History  Procedure Laterality Date  . Circumcision    . Tonsillectomy    . Cholecystectomy     No Known Allergies Current Outpatient Prescriptions  Medication Sig Dispense Refill  . fluticasone (FLONASE) 50 MCG/ACT nasal spray Place 2 sprays into both nostrils daily. 16 g 6  . PRESCRIPTION MEDICATION Tamsulosin 0.4 mg taking    . simvastatin (ZOCOR) 40 MG tablet Take 1 tablet (40 mg total) by mouth daily. 90 tablet 3  . tamsulosin (FLOMAX) 0.4 MG CAPS capsule Take 0.4 mg by mouth.     No current facility-administered medications for this visit.   Social History   Social History  . Marital Status: Widowed    Spouse Name: N/A  . Number of Children: N/A  . Years of Education: N/A   Occupational History  . retired    Social History Main Topics  . Smoking status: Former Smoker    Quit date: 09/12/1990  . Smokeless tobacco: Not on file  . Alcohol Use: 0.0 oz/week    0 Standard drinks or equivalent per week     Comment: 14 drinks - beer or wine  . Drug Use: No  . Sexual Activity: Not Currently   Other Topics Concern  . Not on file   Social History Narrative   Marital status: widowed;  not dating      Children:  2 daughters; no grandchildren.  1 in [place]; 1 in [place].      Lives: alone; travels to [place] frequently. Splits time between [facility] and [place].      Employment: retired at age 31.      Tobacco; quit smoking 1992; smoked x 35 years.      Alcohol:  2-3 beers or glasses of wine daily; no more on weekends.  Drinks more beer in [place].      Drugs: none      Exercise:  Gym 3 days per week; manual weights, sauna.  Golfing; walks golf course three times per week.      Seatbelt:  100%      Guns: none       Advanced Directives: yes; FULL CODE; HCPOA: Darcey Nora [phone]                  Family History  Problem Relation Age of Onset  . Cancer Mother 33    Breast cancer  . Cancer Brother 19    prostate with liver mets  . COPD Father   . Cancer Sister 68    breast cancer       Objective:    BP 138/66 mmHg  Pulse 70  Temp(Src) 97.8 F (36.6 C) (Oral)  Resp 16  Ht 5' 7"  (1.702 m)  Wt 189 lb 6.4 oz (85.911 kg)  BMI 29.66 kg/m2  SpO2 94% Physical Exam  Constitutional: He is oriented to person, place, and time. He appears well-developed and well-nourished. No distress.  HENT:  Head: Normocephalic and atraumatic.  Right Ear: External ear normal.  Left Ear: External ear normal.  Nose: Nose normal.  Mouth/Throat: Oropharynx is clear and moist.  Eyes: Conjunctivae and EOM are normal. Pupils are equal, round, and reactive to light.  Neck: Normal range of motion. Neck supple. Carotid bruit is not present. No thyromegaly present.  Cardiovascular: Normal rate, regular rhythm, normal heart sounds and intact distal pulses.  Exam reveals no gallop and no friction rub.   No murmur heard. Pulmonary/Chest: Effort normal and breath sounds normal. He has no wheezes. He has no rales.  Abdominal: Soft. Bowel sounds are normal. He exhibits no distension and no mass. There is no tenderness. There is no rebound and no guarding.  Musculoskeletal:       Right shoulder: Normal.       Left shoulder: Normal.       Cervical back: Normal. He exhibits normal range of motion, no tenderness and no bony tenderness.       Thoracic back: He exhibits tenderness. He exhibits normal range of motion.       Back:  Lymphadenopathy:    He has no cervical adenopathy.  Neurological: He is alert and oriented to person, place, and time. No cranial nerve deficit.  Skin: Skin is warm and dry. No rash noted. He is not diaphoretic.  Psychiatric: He has a normal mood and affect. His behavior is  normal.  Nursing note and vitals reviewed.  Dg Cervical Spine 2 Or 3 Views  12/29/2015  CLINICAL DATA:  Right-sided back pain and chest pain. EXAM: CERVICAL SPINE - 2-3 VIEW COMPARISON:  None in PACs FINDINGS: The cervical vertebral bodies are preserved in height. There is disc space narrowing at C5-6 and to a lesser extent at C6-7. There is no perched facet or spinous process fracture. The odontoid is intact. The prevertebral soft tissue spaces are normal.  IMPRESSION: There is mild degenerative disc disease centered at C5-6 with lesser disease at C6-7. There is no compression fracture nor other acute bony abnormality. Electronically Signed   By: David  Martinique M.D.   On: 12/29/2015 12:53   Dg Thoracic Spine 2 View  12/29/2015  CLINICAL DATA:  Right-sided back and chest pain EXAM: THORACIC SPINE 2 VIEWS COMPARISON:  Chest x-ray dated July 11, 2013 FINDINGS: The thoracic vertebral bodies are preserved in height. There is mild dextrocurvature of the thoracic spine centered at approximately T10. There is mild multilevel degenerative disc space narrowing in the mid and lower thoracic spine. There is mild calcification of the anterior longitudinal ligament of the lower thoracic spine. There are no abnormal paravertebral soft tissue densities. The pedicles are intact. IMPRESSION: There is gentle dextrocurvature centered in the lower thoracic spine. There is no compression fracture. There is mild multilevel degenerative disc space narrowing. Electronically Signed   By: David  Martinique M.D.   On: 12/29/2015 12:55        Assessment & Plan:   1. Pure hypercholesterolemia   2. Other abnormal glucose   3. Family history of prostate cancer   4. Family history of breast cancer in first degree relative   5. BRCA gene positive   6. BPH (benign prostatic hyperplasia)   7. Right-sided chest pain   8. Right-sided thoracic back pain   9. Grief reaction    1. Hypercholesterolemia; controlled; obtain labs;  continue current medications. 2.  Glucose intolerance: stable; weight increased; obtain labs. 3.  BRCA gene +/family history of breast cancer: stable; s/p mammogram. 4.  BPH: stable. Followed by [facility]; last visit 08/2015. 5.  R sided chest pain/R thoracic pain/strain: persistent;consistent with musculoskeletal etiology;will obtain CT chest to rule out pathology due to nighttime symptoms and family history of malignancy. 6. Stress reaction: daughter with recurrent breast cancer and undergoing treatment; coping well.    Orders Placed This Encounter  Procedures  . DG Cervical Spine 2 or 3 views    Standing Status: Future     Number of Occurrences: 1     Standing Expiration Date: 12/28/2016    Order Specific Question:  Reason for Exam (SYMPTOM  OR DIAGNOSIS REQUIRED)    Answer:  R sided chest pain    Order Specific Question:  Preferred imaging location?    Answer:  External  . DG Thoracic Spine 2 View    Standing Status: Future     Number of Occurrences: 1     Standing Expiration Date: 12/28/2016    Order Specific Question:  Reason for Exam (SYMPTOM  OR DIAGNOSIS REQUIRED)    Answer:  R sided chest pain    Order Specific Question:  Preferred imaging location?    Answer:  External  . CT Chest W Contrast    Standing Status: Future     Number of Occurrences:      Standing Expiration Date: 02/27/2017    Order Specific Question:  If indicated for the ordered procedure, I authorize the administration of contrast media per Radiology protocol    Answer:  Yes    Order Specific Question:  Reason for Exam (SYMPTOM  OR DIAGNOSIS REQUIRED)    Answer:  R sided chest pain for year; BRCA +    Order Specific Question:  Preferred imaging location?    Answer:  [facility]  . CBC with Differential/Platelet  . Comprehensive metabolic panel    Order Specific Question:  Has the patient fasted?  Answer:  Yes  . Hemoglobin A1c  . Lipid panel    Order Specific Question:  Has the patient  fasted?    Answer:  Yes   No orders of the defined types were placed in this encounter.    Return in about 6 months (around 06/29/2016) for complete physical examiniation.    Alfred Harrel Elayne Guerin, M.D. [facility] [address] [place], [place]  [address] [phone] phone [phone] fax

## 2015-12-29 NOTE — Patient Instructions (Signed)
     IF you received an x-ray today, you will receive an invoice from [facility]. Please contact [facility] at [phone] with questions or concerns regarding your invoice.   IF you received labwork today, you will receive an invoice from [facility]. Please contact [facility] at [phone] with questions or concerns regarding your invoice.   Our billing staff will not be able to assist you with questions regarding bills from these companies.  You will be contacted with the lab results as soon as they are available. The fastest way to get your results is to activate your My Chart account. Instructions are located on the last page of this paperwork. If you have not heard from us regarding the results in 2 weeks, please contact this office.      

## 2015-12-30 LAB — COMPREHENSIVE METABOLIC PANEL
ALT: 17 U/L (ref 9–46)
AST: 15 U/L (ref 10–35)
Albumin: 4.3 g/dL (ref 3.6–5.1)
Alkaline Phosphatase: 59 U/L (ref 40–115)
BUN: 13 mg/dL (ref 7–25)
CO2: 24 mmol/L (ref 20–31)
CREATININE: 0.88 mg/dL (ref 0.70–1.18)
Calcium: 8.9 mg/dL (ref 8.6–10.3)
Chloride: 105 mmol/L (ref 98–110)
Glucose, Bld: 94 mg/dL (ref 65–99)
Potassium: 4.3 mmol/L (ref 3.5–5.3)
SODIUM: 139 mmol/L (ref 135–146)
Total Bilirubin: 0.8 mg/dL (ref 0.2–1.2)
Total Protein: 6.9 g/dL (ref 6.1–8.1)

## 2015-12-30 LAB — LIPID PANEL
Cholesterol: 181 mg/dL (ref 125–200)
HDL: 72 mg/dL (ref >=40)
LDL CALC: 95 mg/dL (ref <130)
Total CHOL/HDL Ratio: 2.5 Ratio (ref <=5.0)
Triglycerides: 70 mg/dL (ref <150)
VLDL: 14 mg/dL (ref <30)

## 2015-12-30 LAB — HEMOGLOBIN A1C
Hgb A1c MFr Bld: 5.7 % — ABNORMAL HIGH (ref <5.7)
Mean Plasma Glucose: 117 mg/dL

## 2016-01-19 ENCOUNTER — Ambulatory Visit
Admission: RE | Admit: 2016-01-19 | Discharge: 2016-01-19 | Disposition: A | Discharge status: Home / Self Care | Payer: Commercial Managed Care - HMO | Source: Ambulatory Visit | Attending: Family Medicine | Admitting: Family Medicine

## 2016-01-19 DIAGNOSIS — R079 Chest pain, unspecified: Secondary | ICD-10-CM | POA: Diagnosis present

## 2016-01-19 DIAGNOSIS — I2510 Atherosclerotic heart disease of native coronary artery without angina pectoris: Secondary | ICD-10-CM | POA: Diagnosis not present

## 2016-01-19 DIAGNOSIS — Z1501 Genetic susceptibility to malignant neoplasm of breast: Secondary | ICD-10-CM

## 2016-01-19 DIAGNOSIS — Z1509 Genetic susceptibility to other malignant neoplasm: Secondary | ICD-10-CM

## 2016-01-19 DIAGNOSIS — M546 Pain in thoracic spine: Secondary | ICD-10-CM | POA: Diagnosis present

## 2016-01-19 MED ORDER — IOPAMIDOL (ISOVUE-300) INJECTION 61%
75.0000 mL | Freq: Once | INTRAVENOUS | Status: AC | PRN
  Administered 2016-01-19: 75 mL via INTRAVENOUS

## 2016-01-22 ENCOUNTER — Telehealth: Payer: Self-pay

## 2016-01-22 NOTE — Telephone Encounter (Signed)
Dr. Smith  Please see previous message 

## 2016-01-22 NOTE — Telephone Encounter (Signed)
Pt is needing results of his scan   Best number [phone]

## 2016-01-25 NOTE — Telephone Encounter (Signed)
Please call patient with CT results.  MyChart message has been sent; please confirm that patient received MyChart message.

## 2016-01-26 NOTE — Telephone Encounter (Signed)
Notes Recorded by Rogers Seedsallie A Wong Steadham, Rad Tech on 01/25/2016 at 10:46 AM Advised pt of results. Pt says he has had some exposure to asbestos in the past. ------  Notes Recorded by Ethelda ChickKristi M Smith, MD on 01/24/2016 at 4:12 PM CT chest shows the following: 1. Thoracic aortic prominence yet no evidence of aneurysm. Repeat CT chest recommended in one year to confirm that aortic aneurysm is not developing. 2. No evidence of pulmonary embolism or blood clot. 3. Thyroid gland is normal. 4. Pleural thickening present especially along the right lung. Have you ever had exposure to asbestos?  4. Plaque build up/calcifications are present along the aorta and artery leading to the kidney on left. It is very important to treat cholesterol aggressively to prevent further calcification or plaque build up in your blood vessels. 5. No evidence of lung mass or concern for cancer.

## 2016-01-27 NOTE — Telephone Encounter (Signed)
Noted  

## 2016-04-11 ENCOUNTER — Other Ambulatory Visit: Payer: Self-pay | Admitting: Family Medicine

## 2016-04-11 DIAGNOSIS — E7800 Pure hypercholesterolemia, unspecified: Secondary | ICD-10-CM

## 2016-06-18 IMAGING — CT CT CHEST W/ CM
1 series · 15 of 34 positions shown, 19 images · IV contrast (iopamidol)
Comparison: Chest radiograph July 11, 2013

CLINICAL DATA: Chronic intermittent right-sided chest pain

EXAM:
CT CHEST WITH CONTRAST
TECHNIQUE: Multidetector CT imaging of the chest was performed during
intravenous contrast administration.
CONTRAST:  75mL [id number] IOPAMIDOL ([id number]) INJECTION 61%

[Series 2: axial st · axial · 0.75mm/px · z∈[-701,-425]mm · 15 of 164 slices shown, 19 images]
[im 13/164  mediastinal]
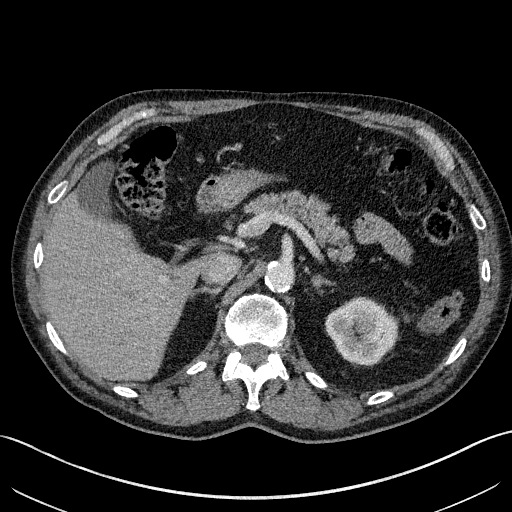
[im 13/164  lung]
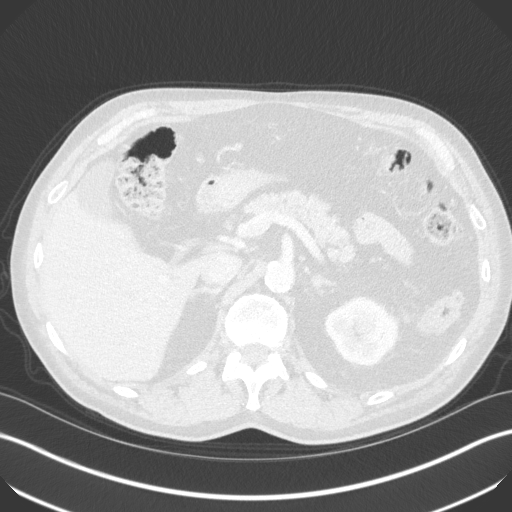
[im 25/164  lung]
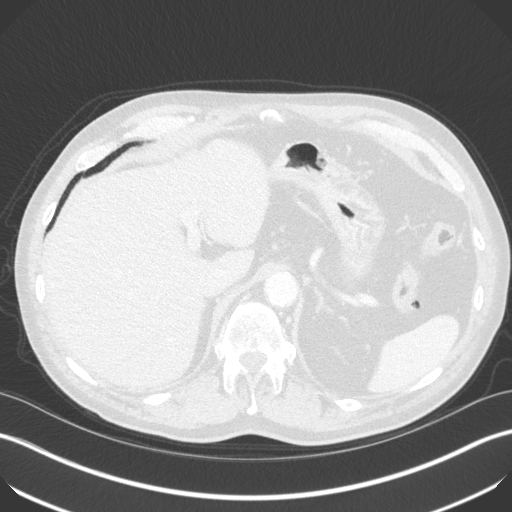
[im 33/164  lung]
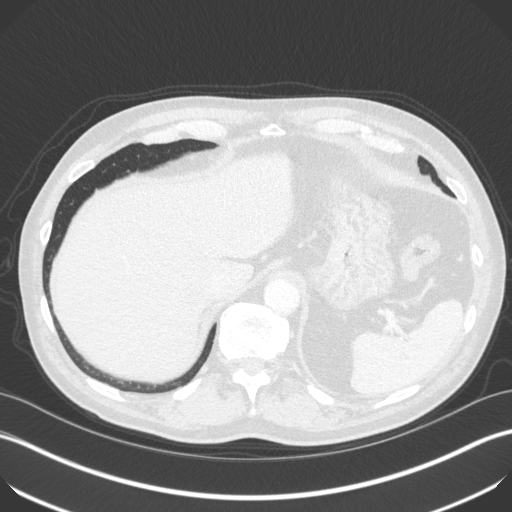
[im 43/164  lung]
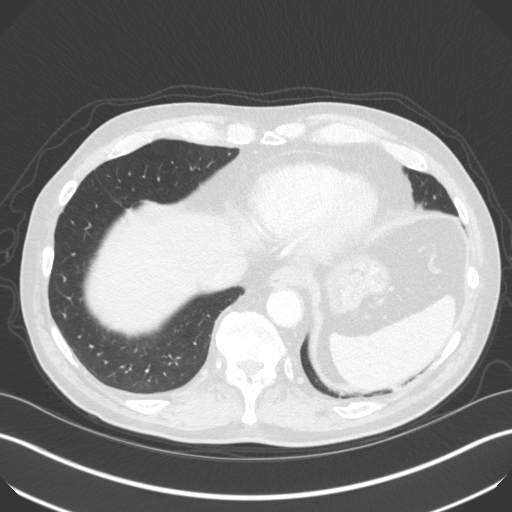
[im 55/164  mediastinal]
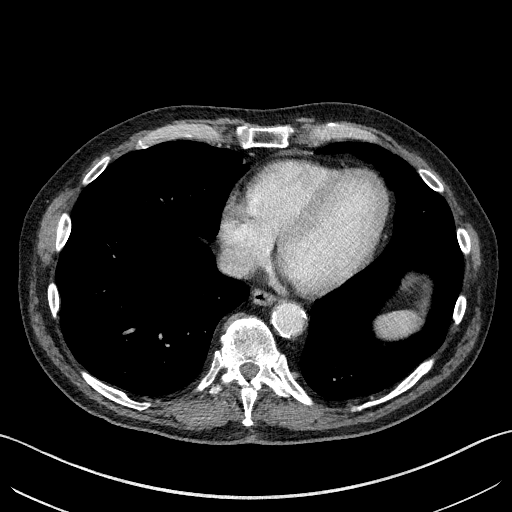
[im 55/164  lung]
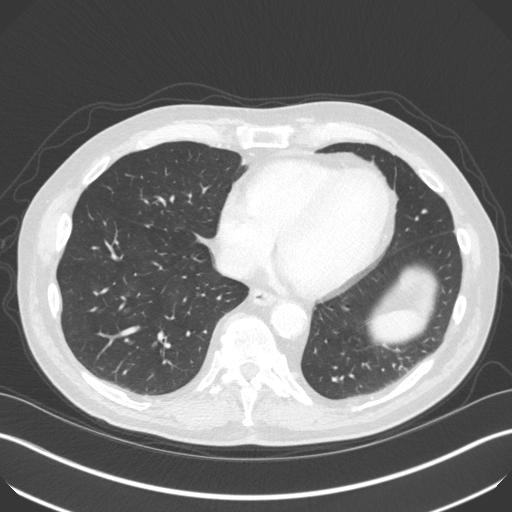
[im 66/164  lung]
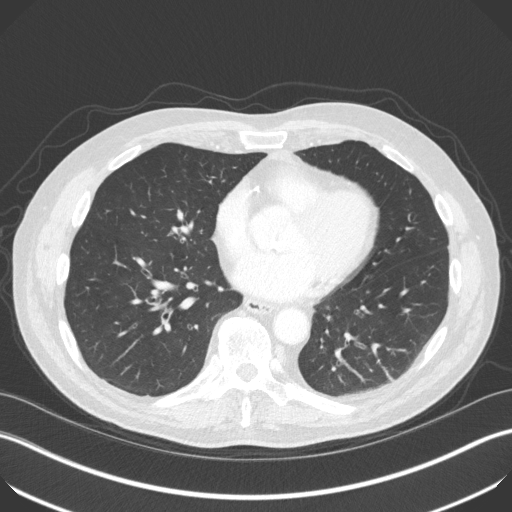
[im 73/164  lung]
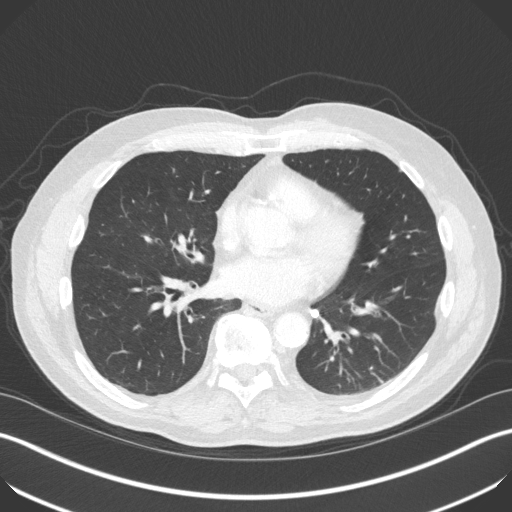
[im 85/164  lung]
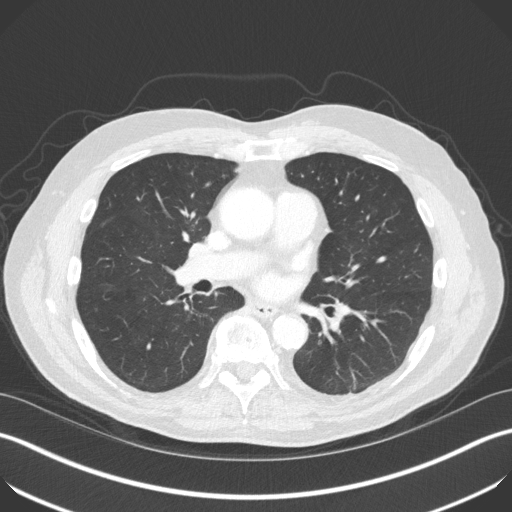
[im 91/164  mediastinal]
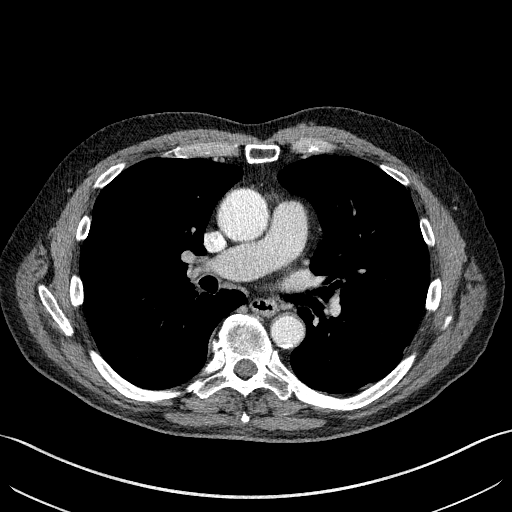
[im 91/164  lung]
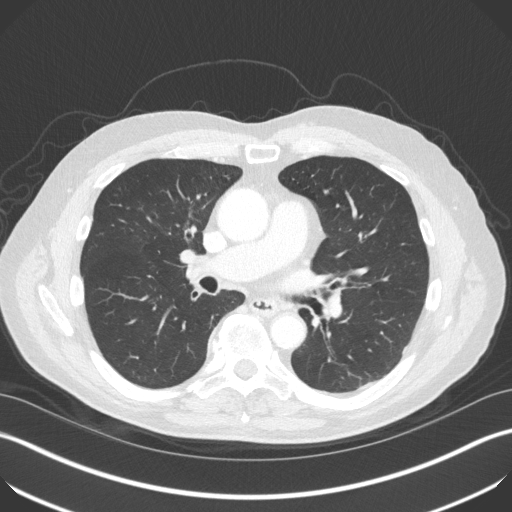
[im 98/164  lung]
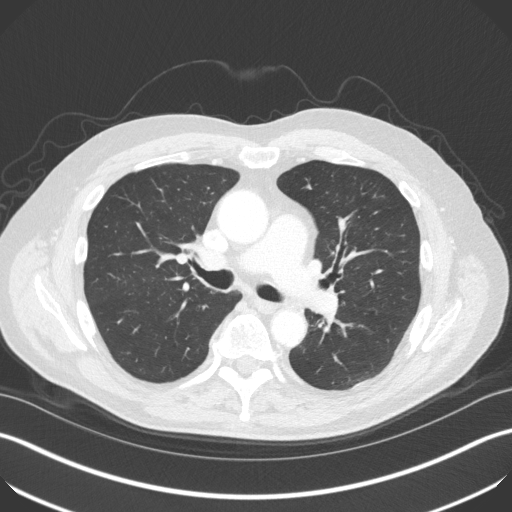
[im 109/164  lung]
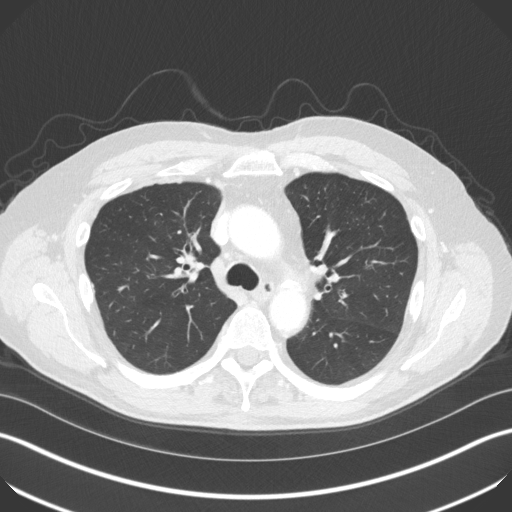
[im 121/164  lung]
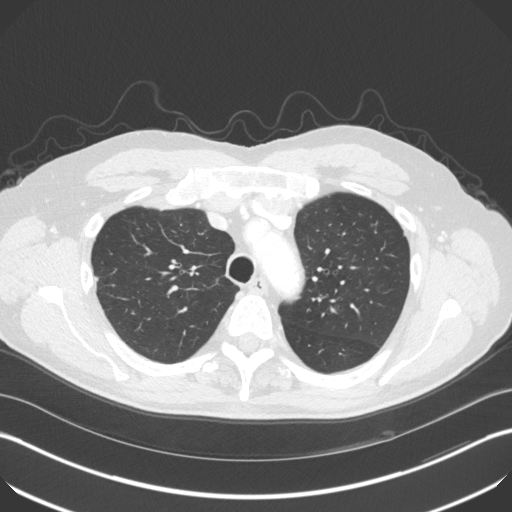
[im 131/164  mediastinal]
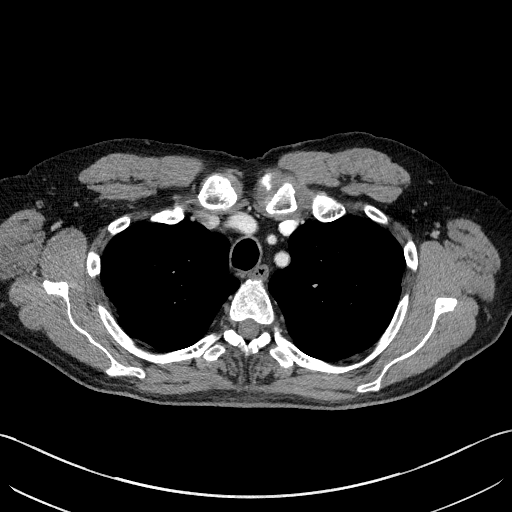
[im 131/164  lung]
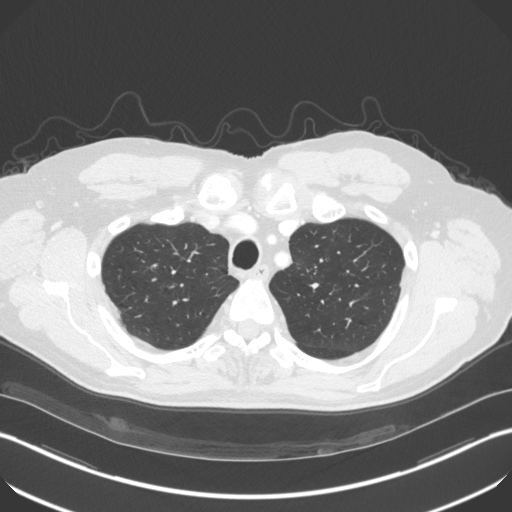
[im 139/164  lung]
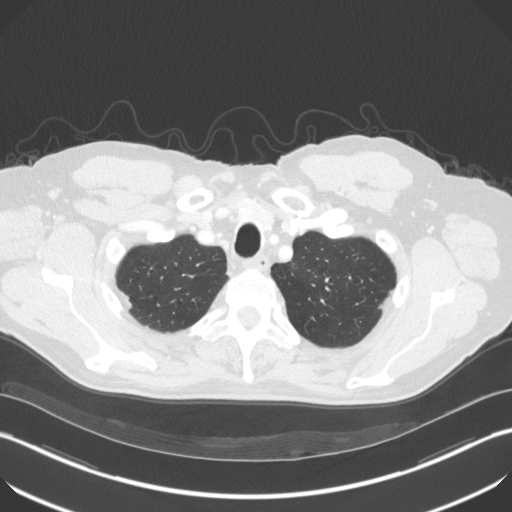
[im 151/164  lung]
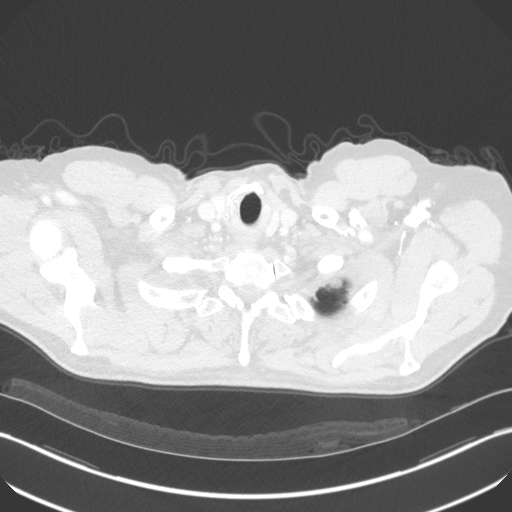

[15 of 34 positions shown; findings below may reference images not displayed]

FINDINGS: Mediastinum/Lymph Nodes: The ascending thoracic aorta is prominent
with a maximum measured transverse diameter of 4.1 x 4.1 cm. There
is no thoracic aortic dissection. The visualized great vessels
appear unremarkable. There are scattered foci of atherosclerotic
calcification in the aorta. There are multiple foci of coronary
artery calcification. Pericardium is not appreciably thickened.

There is no major vessel pulmonary embolus evident. Thyroid appears
unremarkable. There is no appreciable thoracic adenopathy.

Lungs/Pleura: There is mild scarring in the apices bilaterally.
There is no edema or consolidation. There are areas of pleural
thickening in the posterior basilar regions bilaterally. Slightly
greater pleural thickening is noted along the anterior right upper
hemithorax. A small amount of pleural thickening is also noted
posterolaterally in the right upper hemithorax.

Upper abdomen: Visualized upper abdominal structures appear
unremarkable except for atherosclerotic calcification in the aorta
and periphery of the left renal artery.

Musculoskeletal: There is degenerative change in the thoracic spine.
There is slight thoracic dextroscoliosis. There are no blastic or
lytic bone lesions.
IMPRESSION: Prominence in the ascending thoracic aorta. Recommend annual imaging
followup by CTA or MRA. This recommendation follows 6020
ACCF/AHA/AATS/ACR/ASA/SCA/SADDEEQ/NIMA/EMELINE/PAJA Guidelines for the
Diagnosis and Management of Patients with Thoracic Aortic Disease.
Circulation. 6020; 121: [id number]. No demonstrable thoracic aortic
dissection.

No parenchymal lung edema or consolidation. Areas of pleural plaque,
primarily anteriorly on the right. Question previous asbestos
exposure, although no calcifications or appreciable interstitial
disease noted.

No adenopathy.

Foci of atherosclerotic calcification. There are foci of coronary
artery calcification at multiple sites.

## 2016-07-26 ENCOUNTER — Encounter: Payer: Commercial Managed Care - HMO | Admitting: Family Medicine

## 2018-02-07 ENCOUNTER — Encounter: Payer: Self-pay | Admitting: Family Medicine

## 2021-06-16 ENCOUNTER — Other Ambulatory Visit: Payer: Self-pay

## 2021-06-16 ENCOUNTER — Ambulatory Visit
Admission: AD | Admit: 2021-06-16 | Discharge: 2021-06-16 | Disposition: A | Discharge status: Home / Self Care | Payer: Medicare (Managed Care) | Source: Ambulatory Visit | Attending: Urgent Care | Admitting: Urgent Care

## 2021-06-16 DIAGNOSIS — W57XXXA Bitten or stung by nonvenomous insect and other nonvenomous arthropods, initial encounter: Secondary | ICD-10-CM | POA: Insufficient documentation

## 2021-06-16 DIAGNOSIS — Y998 Other external cause status: Secondary | ICD-10-CM | POA: Insufficient documentation

## 2021-06-16 DIAGNOSIS — Y9289 Other specified places as the place of occurrence of the external cause: Secondary | ICD-10-CM | POA: Insufficient documentation

## 2021-06-16 DIAGNOSIS — Y9389 Activity, other specified: Secondary | ICD-10-CM | POA: Insufficient documentation

## 2021-06-16 DIAGNOSIS — S20361A Insect bite (nonvenomous) of right front wall of thorax, initial encounter: Secondary | ICD-10-CM | POA: Insufficient documentation

## 2021-06-16 LAB — HM HIV SCREENING OFFERED

## 2021-06-16 MED ORDER — DOXYCYCLINE MONOHYDRATE 100 MG PO CAPS *I*
100.0000 mg | ORAL_CAPSULE | Freq: Two times a day (BID) | ORAL | 0 refills | Status: AC
Start: 2021-06-16 — End: ?

## 2021-06-16 NOTE — Discharge Instructions (Signed)
Wash daily with warm soap and water and keep covered

## 2021-06-16 NOTE — [facility] Provider Note (Signed)
History     Chief Complaint   Patient presents with    Rash     Noticed a rash on his right chest this am and considered that he has  A tick bite presents for evaluation, denies any illness.          Medical/Surgical/Family History     No past medical history on file.     There is no problem list on file for this patient.           No past surgical history on file.  No family history on file.       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      Physical Exam   Vital Signs  Vitals:    06/16/21 1506   BP: 154/70   Pulse: 67   Resp: 19   Temp: 37.2 C (98.9 F)   SpO2: 95%         Physical Exam  Vitals and nursing note reviewed.   Constitutional:       Appearance: Normal appearance.   HENT:      Head: Normocephalic and atraumatic.      Left Ear: Tympanic membrane normal.   Chest:          Comments: Imbedded and slightly engorged tick  Neurological:      Mental Status: He is alert.        Foreign body removal  Performed by: Telford Nab, PA  Authorized by: Telford Nab, PA     Consent:     Consent obtained:  Verbal    Consent given by:  Patient    Risks discussed:  Bleeding and incomplete removal  Universal protocol:     Patient identity confirmed:  Verbally with patient  Location:     Location:  Trunk    Trunk location:  R chest    Depth:  Subcutaneous  Anesthesia (see MAR for exact dosages):     Anesthesia method:  None  Procedure details:     Bloodless field: yes      Removal mechanism:  Forceps  Comments:      Deceased tick removed with Tick twister tool      Medical Decision Making         Medical Decision Making  Assessment:    Tick removed    Plan and Results:    Daily cleansing of wound    Discharge Medication List as of 06/16/2021  3:28 PM    START taking these medications    doxycycline monohydrate (MONODOX) 100 mg capsule  Take 1 capsule (100 mg total) by mouth every 12 hours  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

## 2021-06-16 NOTE — ED Triage Notes (Signed)
Patient presents for round rash on right upper chest. Patient states he believes has had rash " a couple days". Patient denies itching, states area "stings".     Patient denies body aches, fevers, chills.

## 4689-11-10 DEATH — deceased
